# Patient Record
Sex: Male | Born: 1965
Health system: Southern US, Community
[De-identification: ages and names within clinical notes are randomized; demographics above are authoritative.]

## PROBLEM LIST (undated history)

## (undated) DIAGNOSIS — E7800 Pure hypercholesterolemia, unspecified: Secondary | ICD-10-CM

## (undated) HISTORY — DX: Pure hypercholesterolemia, unspecified: E78.00

## (undated) HISTORY — PX: NO PAST SURGERIES: SHX2092

---

## 2008-03-27 ENCOUNTER — Emergency Department (HOSPITAL_COMMUNITY): Admission: EM | Admit: 2008-03-27 | Discharge: 2008-03-27 | Payer: Self-pay | Admitting: Family Medicine

## 2011-04-13 LAB — POCT RAPID STREP A: Streptococcus, Group A Screen (Direct): NEGATIVE

## 2014-03-20 ENCOUNTER — Other Ambulatory Visit (HOSPITAL_COMMUNITY): Payer: Self-pay | Admitting: Orthopaedic Surgery

## 2014-03-20 DIAGNOSIS — M25531 Pain in right wrist: Secondary | ICD-10-CM

## 2014-03-22 ENCOUNTER — Ambulatory Visit (HOSPITAL_COMMUNITY)
Admission: RE | Admit: 2014-03-22 | Discharge: 2014-03-22 | Disposition: A | Discharge status: Home / Self Care | Payer: 59 | Source: Ambulatory Visit | Attending: Orthopaedic Surgery | Admitting: Orthopaedic Surgery

## 2014-03-22 DIAGNOSIS — S62123A Displaced fracture of lunate [semilunar], unspecified wrist, initial encounter for closed fracture: Secondary | ICD-10-CM | POA: Diagnosis not present

## 2014-03-22 DIAGNOSIS — S52599A Other fractures of lower end of unspecified radius, initial encounter for closed fracture: Secondary | ICD-10-CM | POA: Insufficient documentation

## 2014-03-22 DIAGNOSIS — X58XXXA Exposure to other specified factors, initial encounter: Secondary | ICD-10-CM | POA: Diagnosis not present

## 2014-03-22 DIAGNOSIS — Z4789 Encounter for other orthopedic aftercare: Secondary | ICD-10-CM | POA: Insufficient documentation

## 2014-03-22 DIAGNOSIS — M25531 Pain in right wrist: Secondary | ICD-10-CM

## 2014-03-22 DIAGNOSIS — R937 Abnormal findings on diagnostic imaging of other parts of musculoskeletal system: Secondary | ICD-10-CM | POA: Insufficient documentation

## 2014-03-22 DIAGNOSIS — M25539 Pain in unspecified wrist: Secondary | ICD-10-CM | POA: Diagnosis present

## 2015-07-30 MED FILL — ESZOPICLONE 3 MG TABLET: 3 | 30 days supply | Qty: 30 | Fill #0

## 2015-11-14 MED FILL — ESZOPICLONE 3 MG TABLET: 3 | 30 days supply | Qty: 30 | Fill #0

## 2016-02-10 MED FILL — ESZOPICLONE 3 MG TABLET: 3 | 30 days supply | Qty: 30 | Fill #0

## 2016-05-20 MED FILL — ESZOPICLONE 3 MG TABLET: 3 | 30 days supply | Qty: 30 | Fill #0

## 2016-06-12 MED FILL — GAVILYTE-N SOLUTION: 420 | 1 days supply | Qty: 4000 | Fill #0

## 2016-06-22 DIAGNOSIS — Z800 Family history of malignant neoplasm of digestive organs: Secondary | ICD-10-CM | POA: Diagnosis not present

## 2016-06-22 DIAGNOSIS — Z86010 Personal history of colonic polyps: Secondary | ICD-10-CM | POA: Diagnosis not present

## 2016-07-09 DIAGNOSIS — G4726 Circadian rhythm sleep disorder, shift work type: Secondary | ICD-10-CM | POA: Diagnosis not present

## 2016-07-09 DIAGNOSIS — Z0000 Encounter for general adult medical examination without abnormal findings: Secondary | ICD-10-CM | POA: Diagnosis not present

## 2016-07-09 DIAGNOSIS — Z125 Encounter for screening for malignant neoplasm of prostate: Secondary | ICD-10-CM | POA: Diagnosis not present

## 2016-08-05 MED FILL — ESZOPICLONE 3 MG TABLET: 3 | 30 days supply | Qty: 30 | Fill #0

## 2016-11-06 MED FILL — ESZOPICLONE 3 MG TABLET: 3 | 30 days supply | Qty: 30 | Fill #0

## 2016-12-23 ENCOUNTER — Emergency Department (HOSPITAL_BASED_OUTPATIENT_CLINIC_OR_DEPARTMENT_OTHER): Payer: Worker's Compensation

## 2016-12-23 ENCOUNTER — Encounter (HOSPITAL_BASED_OUTPATIENT_CLINIC_OR_DEPARTMENT_OTHER): Payer: Self-pay | Admitting: *Deleted

## 2016-12-23 ENCOUNTER — Emergency Department (HOSPITAL_BASED_OUTPATIENT_CLINIC_OR_DEPARTMENT_OTHER)
Admission: EM | Admit: 2016-12-23 | Discharge: 2016-12-23 | Disposition: A | Discharge status: Home / Self Care | Payer: Worker's Compensation | Attending: Emergency Medicine | Admitting: Emergency Medicine

## 2016-12-23 DIAGNOSIS — W01198A Fall on same level from slipping, tripping and stumbling with subsequent striking against other object, initial encounter: Secondary | ICD-10-CM | POA: Diagnosis not present

## 2016-12-23 DIAGNOSIS — Y9259 Other trade areas as the place of occurrence of the external cause: Secondary | ICD-10-CM | POA: Insufficient documentation

## 2016-12-23 DIAGNOSIS — Y9301 Activity, walking, marching and hiking: Secondary | ICD-10-CM | POA: Insufficient documentation

## 2016-12-23 DIAGNOSIS — S32010A Wedge compression fracture of first lumbar vertebra, initial encounter for closed fracture: Secondary | ICD-10-CM | POA: Insufficient documentation

## 2016-12-23 DIAGNOSIS — W010XXA Fall on same level from slipping, tripping and stumbling without subsequent striking against object, initial encounter: Secondary | ICD-10-CM

## 2016-12-23 DIAGNOSIS — S3992XA Unspecified injury of lower back, initial encounter: Secondary | ICD-10-CM | POA: Diagnosis present

## 2016-12-23 DIAGNOSIS — Y990 Civilian activity done for income or pay: Secondary | ICD-10-CM | POA: Diagnosis not present

## 2016-12-23 MED ORDER — IBUPROFEN 800 MG PO TABS
800.0000 mg | ORAL_TABLET | Freq: Once | ORAL | Status: AC
  Administered 2016-12-23: 800 mg via ORAL
  Filled 2016-12-23: qty 1

## 2016-12-23 MED ORDER — HYDROCODONE-ACETAMINOPHEN 5-325 MG PO TABS: 1.0000 | ORAL_TABLET | ORAL | 0 refills | Status: DC | PRN

## 2016-12-23 MED ORDER — HYDROCODONE-ACETAMINOPHEN 5-325 MG PO TABS
1.0000 | ORAL_TABLET | Freq: Once | ORAL | Status: AC
  Administered 2016-12-23: 1 via ORAL
  Filled 2016-12-23: qty 1

## 2016-12-23 NOTE — ED Provider Notes (Signed)
[facility] Provider Note: Lowella Dell, MD, FACEP  CSN: [id number] MRN: [id number] ARRIVAL: 12/23/16 at 0013 ROOM: MH01/MH01   CHIEF COMPLAINT  Back Injury   HISTORY OF PRESENT ILLNESS  Tyler Villarreal is a 51 y.o. male who slipped at work and fell about 9:30 yesterday evening. He fell onto his lower back. He is having 7 out of 10 pain in his upper lumbar region. The pain does not radiate. Pain is worse with movement of his lower back. He denies numbness or weakness in his lower extremities. He denies bowel or bladder changes. He denies other injury although he did have some transient left calf pain immediately after the fall.  Consultation with the [facility] state controlled substances database reveals the patient has received no opioid prescriptions in the past year.Marland Kitchen   History reviewed. No pertinent past medical history.  History reviewed. No pertinent surgical history.  History reviewed. No pertinent family history.  Social History  Substance Use Topics  . Smoking status: Never Smoker  . Smokeless tobacco: Never Used  . Alcohol use No    Prior to Admission medications   Not on File    Allergies Penicillins   REVIEW OF SYSTEMS  Negative except as noted here or in the History of Present Illness.   PHYSICAL EXAMINATION  Initial Vital Signs Blood pressure (!) 133/93, pulse 69, temperature 98.1 F (36.7 C), temperature source Oral, resp. rate 18, height 5\' 7"  (1.702 m), weight 81.6 kg (180 lb), SpO2 100 %.  Examination General: Well-developed, well-nourished male in no acute distress; appearance consistent with age of record HENT: normocephalic; atraumatic Eyes: pupils equal, round and reactive to light; extraocular muscles intact Neck: supple Heart: regular rate and rhythm Lungs: clear to auscultation bilaterally Abdomen: soft; nondistended; nontender; bowel sounds present Back: No lumbar tenderness; pain on movement of lower  back Extremities: No deformity; full range of motion; pulses normal Neurologic: Awake, alert and oriented; motor function intact in all extremities and symmetric; sensation intact in lower extremities and symmetric; no facial droop Skin: Warm and dry Psychiatric: Normal mood and affect   RESULTS  Summary of this visit's results, reviewed by myself:   EKG Interpretation  Date/Time:    Ventricular Rate:    PR Interval:    QRS Duration:   QT Interval:    QTC Calculation:   R Axis:     Text Interpretation:        Laboratory Studies: No results found for this or any previous visit (from the past 24 hour(s)). Imaging Studies: Dg Lumbar Spine Complete  Result Date: 12/23/2016 CLINICAL DATA:  Low back pain after fall on wet floor at work last night. EXAM: LUMBAR SPINE - COMPLETE 4+ VIEW COMPARISON:  None. FINDINGS: Mild acute appearing L1 compression fracture with less than 30% height loss. The remaining lumbar vertebral bodies intact. No malalignment. Intervertebral disc heights are normal. No destructive bony lesions. Sacroiliac joints are symmetric. Included prevertebral and paraspinal soft tissue planes are non-suspicious. IMPRESSION: Acute mild L1 compression fracture.  No malalignment. Electronically Signed   By: Awilda Metro M.D.   On: 12/23/2016 01:24   Ct Lumbar Spine Wo Contrast  Result Date: 12/23/2016 CLINICAL DATA:  Larey Seat on floor, L1 compression fracture EXAM: CT LUMBAR SPINE WITHOUT CONTRAST TECHNIQUE: Multidetector CT imaging of the lumbar spine was performed without intravenous contrast administration. Multiplanar CT image reconstructions were also generated. COMPARISON:  12/23/2016 FINDINGS: Segmentation: 5 lumbar type vertebrae. Alignment: Normal. Vertebrae: Mild inferior endplate  compression fracture of L1 with approximately 25% loss of height anteriorly. No retropulsion of fracture fragments. Paraspinal and other soft tissues: Negative Disc levels: Mild narrowing at  L1-L2. Remaining disc spaces are maintained. At L2-L3 no significant disc disease or canal stenosis. At L3-L4, no significant canal stenosis or disc disease At L4-L5, mild broad-based disc.  No canal stenosis. At L5 S1, mild diffuse disc bulge. No canal stenosis. Foramen are patent bilaterally. IMPRESSION: Mild inferior endplate compression fracture of L1 with approximate 25% loss of height anteriorly. No retropulsion of fracture fragments. Electronically Signed   By: Jasmine PangKim  Fujinaga M.D.   On: 12/23/2016 02:44    ED COURSE  Nursing notes and initial vitals signs, including pulse oximetry, reviewed.  Vitals:   12/23/16 0025  BP: (!) 133/93  Pulse: 69  Resp: 18  Temp: 98.1 F (36.7 C)  TempSrc: Oral  SpO2: 100%  Weight: 81.6 kg (180 lb)  Height: 5\' 7"  (1.702 m)    PROCEDURES    ED DIAGNOSES     ICD-10-CM   1. Fall from slip, trip, or stumble, initial encounter W01.0XXA   2. Closed compression fracture of first lumbar vertebra, initial encounter (HCC) S32.010A        Henretter Piekarski, Jonny RuizJohn, MD 12/23/16 [phone]

## 2016-12-23 NOTE — ED Triage Notes (Signed)
Pt slipped this Pm on a spill onto lower back presents with lower back pain

## 2016-12-23 NOTE — ED Notes (Signed)
Patient transported to CT 

## 2016-12-24 MED FILL — HYDROCODON-APAP 5-325: 5-325 | 5 days supply | Qty: 30 | Fill #0

## 2016-12-25 MED FILL — METHOCARBAMOL 500 MG TABLET: 500 | 10 days supply | Qty: 20 | Fill #0

## 2016-12-25 MED FILL — MELOXICAM 15 MG TABLET: 15 | 30 days supply | Qty: 30 | Fill #0

## 2016-12-25 MED FILL — CYCLOBENZAPRINE 5 MG TABLET: 5 | 30 days supply | Qty: 60 | Fill #0

## 2016-12-28 MED FILL — HYDROCODON-APAP 5-325: 5-325 | 8 days supply | Qty: 60 | Fill #0

## 2017-01-12 MED FILL — HYDROCODON-APAP 5-325: 5-325 | 8 days supply | Qty: 60 | Fill #0

## 2017-03-12 MED FILL — METHOCARBAMOL 500 MG TABLET: 500 | 30 days supply | Qty: 60 | Fill #0

## 2017-04-07 MED FILL — ESZOPICLONE 3 MG TABLET: 3 | 30 days supply | Qty: 30 | Fill #0

## 2017-07-23 MED FILL — ESZOPICLONE 3 MG TABLET: 3 | 30 days supply | Qty: 30 | Fill #0

## 2018-02-07 MED FILL — ESZOPICLONE 3 MG TABS: 3 | 30 days supply | Qty: 30 | Fill #0

## 2018-05-16 MED FILL — ESZOPICLONE 3 MG TABS: 3 | 30 days supply | Qty: 30 | Fill #0

## 2019-01-31 ENCOUNTER — Ambulatory Visit
Admission: RE | Admit: 2019-01-31 | Discharge: 2019-01-31 | Disposition: A | Discharge status: Home / Self Care | Payer: No Typology Code available for payment source | Source: Ambulatory Visit | Attending: Family Medicine | Admitting: Family Medicine

## 2019-01-31 ENCOUNTER — Other Ambulatory Visit: Payer: Self-pay | Admitting: Family Medicine

## 2019-01-31 DIAGNOSIS — M79671 Pain in right foot: Secondary | ICD-10-CM

## 2019-07-27 ENCOUNTER — Ambulatory Visit: Payer: No Typology Code available for payment source | Attending: Internal Medicine

## 2019-07-27 DIAGNOSIS — Z20822 Contact with and (suspected) exposure to covid-19: Secondary | ICD-10-CM

## 2019-07-28 ENCOUNTER — Telehealth: Payer: Self-pay | Admitting: General Practice

## 2019-07-28 LAB — NOVEL CORONAVIRUS, NAA: SARS-CoV-2, NAA: DETECTED — AB

## 2019-07-28 NOTE — Telephone Encounter (Signed)
Pt and wife given Covid 19 results. Expressed understanding. NT unavailable and they did not wish to speak with them at the time. Stated they would follow up with pt's PCP.

## 2019-07-30 ENCOUNTER — Other Ambulatory Visit: Payer: Self-pay

## 2019-07-30 ENCOUNTER — Ambulatory Visit
Admission: EM | Admit: 2019-07-30 | Discharge: 2019-07-30 | Disposition: A | Discharge status: Home / Self Care | Payer: No Typology Code available for payment source | Attending: Family Medicine | Admitting: Family Medicine

## 2019-07-30 ENCOUNTER — Telehealth: Payer: No Typology Code available for payment source

## 2019-07-30 ENCOUNTER — Ambulatory Visit (INDEPENDENT_AMBULATORY_CARE_PROVIDER_SITE_OTHER): Payer: No Typology Code available for payment source

## 2019-07-30 DIAGNOSIS — J189 Pneumonia, unspecified organism: Secondary | ICD-10-CM | POA: Diagnosis not present

## 2019-07-30 DIAGNOSIS — U071 COVID-19: Secondary | ICD-10-CM

## 2019-07-30 DIAGNOSIS — R0602 Shortness of breath: Secondary | ICD-10-CM

## 2019-07-30 MED ORDER — LEVOFLOXACIN 750 MG PO TABS: 750.0000 mg | ORAL_TABLET | Freq: Every day | ORAL | 0 refills | Status: AC

## 2019-07-30 NOTE — Discharge Instructions (Signed)
Rest, fluids, over the counter medications °

## 2019-07-30 NOTE — ED Triage Notes (Signed)
Patient states that he was diagnosed with Covid on Friday. Patient states that today he is having worsening shortness of breath, unable to rest/sleep, severe fatigue, headache. Patient states that he did an E-visit and he was advised to come here and be evaluated for possible chest x-ray.

## 2019-08-01 ENCOUNTER — Other Ambulatory Visit: Payer: Self-pay

## 2019-08-01 ENCOUNTER — Encounter: Payer: Self-pay | Admitting: Emergency Medicine

## 2019-08-01 ENCOUNTER — Ambulatory Visit
Admission: EM | Admit: 2019-08-01 | Discharge: 2019-08-01 | Disposition: A | Discharge status: Home / Self Care | Payer: No Typology Code available for payment source | Attending: Family Medicine | Admitting: Family Medicine

## 2019-08-01 DIAGNOSIS — J1282 Pneumonia due to coronavirus disease 2019: Secondary | ICD-10-CM | POA: Diagnosis not present

## 2019-08-01 DIAGNOSIS — U071 COVID-19: Secondary | ICD-10-CM

## 2019-08-01 DIAGNOSIS — R059 Cough, unspecified: Secondary | ICD-10-CM

## 2019-08-01 DIAGNOSIS — R05 Cough: Secondary | ICD-10-CM | POA: Diagnosis not present

## 2019-08-01 MED ORDER — PREDNISONE 50 MG PO TABS: ORAL_TABLET | ORAL | 0 refills | Status: DC

## 2019-08-01 MED ORDER — HYDROCODONE-HOMATROPINE 5-1.5 MG/5ML PO SYRP: 5.0000 mL | ORAL_SOLUTION | Freq: Four times a day (QID) | ORAL | 0 refills | Status: DC | PRN

## 2019-08-01 NOTE — ED Provider Notes (Addendum)
[facility]    CSN: [id number] Arrival date & time: 08/01/19  0254      History   Chief Complaint Chief Complaint  Patient presents with  . covid positive    APPT  . Shortness of Breath   HPI  54 year old male presents with the above complaints.  Patient recently seen here on 1/17.  Tested positive for COVID-19.  Chest x-ray revealed findings consistent with multifocal pneumonia.  I suspect that this is secondary to COVID-19 as opposed to being bacterial pneumonia.  Patient was placed empirically on Levaquin.  Patient reports that he continues to feel poorly.  He does not feel like the antibiotic is working.  He continues to have cough and shortness of breath.  He has difficulty sleeping as a result.  He also reports back pain in the thoracic region.  Rates his pain a 6/10 in severity.  He has been taking over-the-counter Delsym for cough without resolution.  Patient feels very poorly. Feels fatigued.  He is concerned given the lack of improvement in symptoms.  No other complaints or concerns at this time.  PMH, Surgical Hx, Family Hx, Social History reviewed and updated as below.  No significant PMH.  Past Surgical History:  Procedure Laterality Date  . NO PAST SURGERIES     Home Medications    Prior to Admission medications   Medication Sig Start Date End Date Taking? Authorizing Provider  levofloxacin (LEVAQUIN) 750 MG tablet Take 1 tablet (750 mg total) by mouth daily for 5 days. 07/30/19 08/04/19 Yes Conty, Linward Foster, MD  HYDROcodone-homatropine [facility]) 5-1.5 MG/5ML syrup Take 5 mLs by mouth every 6 (six) hours as needed. 08/01/19   Coral Spikes, DO  predniSONE (DELTASONE) 50 MG tablet 1 tablet daily x 5 days 08/01/19   Coral Spikes, DO   Family History Family History  Problem Relation Age of Onset  . Parkinson's disease Mother   . Diabetes Father   . Stomach cancer Father    Social History Social History   Tobacco Use  . Smoking status: Never Smoker    . Smokeless tobacco: Never Used  Substance Use Topics  . Alcohol use: Yes    Comment: occasionally  . Drug use: No   Allergies   Penicillins   Review of Systems Review of Systems  Constitutional: Positive for fatigue.  Respiratory: Positive for cough and shortness of breath.    Physical Exam Triage Vital Signs ED Triage Vitals [08/01/19 0851]  Enc Vitals Group     BP (!) 149/101     Pulse Rate 96     Resp 18     Temp 98.8 F (37.1 C)     Temp Source Oral     SpO2 98 %     Weight 182 lb (82.6 kg)     Height 5\' 7"  (1.702 m)     Head Circumference      Peak Flow      Pain Score 6     Pain Loc      Pain Edu?      Excl. in [place]?    Updated Vital Signs BP (!) 149/101 (BP Location: Left Arm)   Pulse 96   Temp 98.8 F (37.1 C) (Oral)   Resp 18   Ht 5\' 7"  (1.702 m)   Wt 82.6 kg   SpO2 98%   BMI 28.51 kg/m   Visual Acuity Right Eye Distance:   Left Eye Distance:   Bilateral Distance:  Right Eye Near:   Left Eye Near:    Bilateral Near:     Physical Exam Vitals and nursing note reviewed.  Constitutional:      Comments: Appears mildly ill and fatigued.  No acute distress.  HENT:     Head: Normocephalic and atraumatic.  Eyes:     General:        Right eye: No discharge.        Left eye: No discharge.     Conjunctiva/sclera: Conjunctivae normal.  Cardiovascular:     Rate and Rhythm: Normal rate and regular rhythm.     Heart sounds: No murmur.  Pulmonary:     Effort: Pulmonary effort is normal.     Breath sounds: No wheezing or rhonchi.     Comments: Patient coughing throughout exam making auscultation difficult. Skin:    General: Skin is warm.     Findings: No rash.  Neurological:     Mental Status: He is alert.  Psychiatric:        Mood and Affect: Mood normal.        Behavior: Behavior normal.    UC Treatments / Results  Labs (all labs ordered are listed, but only abnormal results are displayed) Labs Reviewed - No data to  display  EKG   Radiology DG Chest 2 View  Result Date: 07/30/2019 CLINICAL DATA:  Shortness of breath. EXAM: CHEST - 2 VIEW COMPARISON:  March 27, 2008. FINDINGS: The heart size and mediastinal contours are within normal limits. No pneumothorax or pleural effusion is noted. Multiple ill-defined opacities are noted in the right lower lobe with minimal left basilar opacity, consistent with multifocal pneumonia. The visualized skeletal structures are unremarkable. IMPRESSION: Findings consistent with multifocal pneumonia, right greater than left. Electronically Signed   By: Lupita Raider M.D.   On: 07/30/2019 13:24    Procedures Procedures (including critical care time)  Medications Ordered in UC Medications - No data to display  Initial Impression / Assessment and Plan / UC Course  I have reviewed the triage vital signs and the nursing notes.  Pertinent labs & imaging results that were available during my care of the patient were reviewed by me and considered in my medical decision making (see chart for details).    54 year old male presents with COVID-19 pneumonia.  Previously seen on 1/17. Chest xray reviewed.  Patient worsening,  Placing on prednisone.  Regarding his cough, I am treating him with Hycodan.  Advised him that if he worsens, he needs to go to the hospital given limited outpatient treatment of COVID-19.  Final Clinical Impressions(s) / UC Diagnoses   Final diagnoses:  Pneumonia due to COVID-19 virus  Cough     Discharge Instructions     Stay hydrated.  Medications as prescribed.  If you continue to worsen, you need to go to the hospital.  Take care  Dr. Adriana Simas     ED Prescriptions    Medication Sig Dispense Auth. Provider   HYDROcodone-homatropine (HYCODAN) 5-1.5 MG/5ML syrup Take 5 mLs by mouth every 6 (six) hours as needed. 120 mL Jaselyn Nahm G, DO   predniSONE (DELTASONE) 50 MG tablet 1 tablet daily x 5 days 5 tablet Everlene Other G, DO     PDMP  not reviewed this encounter.   Tommie Sams, DO 08/01/19 0943    Tommie Sams, DO 08/01/19 [phone]

## 2019-08-01 NOTE — ED Triage Notes (Signed)
Patient in today c/o continued sob. Patient is covid positive. Patient seen her 07/30/19 for same and had a CXR and diagnosed with pneumonia. Patient was given an antibiotic.

## 2019-08-01 NOTE — Discharge Instructions (Signed)
Stay hydrated.  Medications as prescribed.  If you continue to worsen, you need to go to the hospital.  Take care  Dr. Adriana Simas

## 2019-08-02 ENCOUNTER — Telehealth: Payer: Self-pay | Admitting: Internal Medicine

## 2019-08-02 NOTE — Telephone Encounter (Signed)
Called to discuss with Maris Berger about Covid symptoms and the use of bamlanivimab, a monoclonal antibody infusion for those with mild to moderate Covid symptoms and at a high risk of hospitalization.     Pt is not qualified for this infusion due to lack of identified risk factors and co-morbid conditions.  Symptoms reviewed as well as criteria for ending isolation.  Symptoms reviewed that would warrant ED/Hospital evaluation as well should her condition worsen. Preventative practices reviewed. Patient verbalized understanding.    There are no problems to display for this patient.  Cordelia Pen, NP-C [facility]  pgr [phone]

## 2019-08-02 NOTE — ED Provider Notes (Signed)
[facility]    CSN: [id number] Arrival date & time: 07/30/19  1216      History   Chief Complaint Chief Complaint  Patient presents with  . Covid Positive  . Shortness of Breath    HPI Tyler Villarreal is a 54 y.o. male.   54 yo male with a c/o worsening shortness of breath, fatigue, headaches since testing positive for covid 2 days ago (Friday). Denies any chest pains.    Shortness of Breath   History reviewed. No pertinent past medical history.  There are no problems to display for this patient.   Past Surgical History:  Procedure Laterality Date  . NO PAST SURGERIES         Home Medications    Prior to Admission medications   Medication Sig Start Date End Date Taking? Authorizing Provider  HYDROcodone-homatropine (HYCODAN) 5-1.5 MG/5ML syrup Take 5 mLs by mouth every 6 (six) hours as needed. 08/01/19   Coral Spikes, DO  levofloxacin (LEVAQUIN) 750 MG tablet Take 1 tablet (750 mg total) by mouth daily for 5 days. 07/30/19 08/04/19  Norval Gable, MD  predniSONE (DELTASONE) 50 MG tablet 1 tablet daily x 5 days 08/01/19   Coral Spikes, DO    Family History Family History  Problem Relation Age of Onset  . Parkinson's disease Mother   . Diabetes Father   . Stomach cancer Father     Social History Social History   Tobacco Use  . Smoking status: Never Smoker  . Smokeless tobacco: Never Used  Substance Use Topics  . Alcohol use: Yes    Comment: occasionally  . Drug use: No     Allergies   Penicillins   Review of Systems Review of Systems  Respiratory: Positive for shortness of breath.      Physical Exam Triage Vital Signs ED Triage Vitals  Enc Vitals Group     BP 07/30/19 1224 (!) 135/91     Pulse Rate 07/30/19 1224 (!) 105     Resp 07/30/19 1224 19     Temp 07/30/19 1224 98.9 F (37.2 C)     Temp Source 07/30/19 1224 Oral     SpO2 07/30/19 1224 96 %     Weight 07/30/19 1221 182 lb (82.6 kg)     Height 07/30/19 1221 5\' 7"   (1.702 m)     Head Circumference --      Peak Flow --      Pain Score 07/30/19 1221 7     Pain Loc --      Pain Edu? --      Excl. in [place]? --    No data found.  Updated Vital Signs BP (!) 135/91 (BP Location: Left Arm)   Pulse (!) 105   Temp 98.9 F (37.2 C) (Oral)   Resp 19   Ht 5\' 7"  (1.702 m)   Wt 82.6 kg   SpO2 96%   BMI 28.51 kg/m   Visual Acuity Right Eye Distance:   Left Eye Distance:   Bilateral Distance:    Right Eye Near:   Left Eye Near:    Bilateral Near:     Physical Exam Vitals and nursing note reviewed.  Constitutional:      General: He is not in acute distress.    Appearance: He is not toxic-appearing or diaphoretic.  Cardiovascular:     Rate and Rhythm: Tachycardia present.     Heart sounds: Normal heart sounds.  Pulmonary:  Breath sounds: Rales (bases) present.  Neurological:     Mental Status: He is alert.      UC Treatments / Results  Labs (all labs ordered are listed, but only abnormal results are displayed) Labs Reviewed - No data to display  EKG   Radiology No results found.  Procedures Procedures (including critical care time)  Medications Ordered in UC Medications - No data to display  Initial Impression / Assessment and Plan / UC Course  I have reviewed the triage vital signs and the nursing notes.  Pertinent labs & imaging results that were available during my care of the patient were reviewed by me and considered in my medical decision making (see chart for details).      Final Clinical Impressions(s) / UC Diagnoses   Final diagnoses:  COVID-19 virus infection  Multifocal pneumonia     Discharge Instructions     Rest, fluids, over the counter medications    ED Prescriptions    Medication Sig Dispense Auth. Provider   levofloxacin (LEVAQUIN) 750 MG tablet Take 1 tablet (750 mg total) by mouth daily for 5 days. 5 tablet Payton Mccallum, MD      1. x-ray results and diagnosis reviewed with  patient 2. rx as per orders above; reviewed possible side effects, interactions, risks and benefits  3. Recommend supportive treatment as above 4. Follow-up prn if symptoms worsen or don't improve  PDMP not reviewed this encounter.   Payton Mccallum, MD 08/02/19 [phone]

## 2019-09-01 ENCOUNTER — Ambulatory Visit
Admission: RE | Admit: 2019-09-01 | Discharge: 2019-09-01 | Disposition: A | Discharge status: Home / Self Care | Payer: No Typology Code available for payment source | Source: Ambulatory Visit | Attending: Physician Assistant | Admitting: Physician Assistant

## 2019-09-01 ENCOUNTER — Other Ambulatory Visit: Payer: Self-pay | Admitting: Physician Assistant

## 2019-09-01 DIAGNOSIS — Z09 Encounter for follow-up examination after completed treatment for conditions other than malignant neoplasm: Secondary | ICD-10-CM

## 2020-06-24 ENCOUNTER — Ambulatory Visit: Payer: No Typology Code available for payment source

## 2020-06-24 ENCOUNTER — Ambulatory Visit (INDEPENDENT_AMBULATORY_CARE_PROVIDER_SITE_OTHER): Payer: No Typology Code available for payment source

## 2020-06-24 ENCOUNTER — Ambulatory Visit
Admission: EM | Admit: 2020-06-24 | Discharge: 2020-06-24 | Disposition: A | Discharge status: Home / Self Care | Payer: No Typology Code available for payment source | Attending: Family Medicine | Admitting: Family Medicine

## 2020-06-24 DIAGNOSIS — R2242 Localized swelling, mass and lump, left lower limb: Secondary | ICD-10-CM

## 2020-06-24 DIAGNOSIS — M25572 Pain in left ankle and joints of left foot: Secondary | ICD-10-CM | POA: Diagnosis not present

## 2020-06-24 HISTORY — DX: Pure hypercholesterolemia, unspecified: E78.00

## 2020-06-24 NOTE — ED Triage Notes (Signed)
Pt presents to Urgent Care with c/o L ankle pain. Reports that he "rolled it" while blowing leaves when stepping off uneven surface. He fell but reports no other injuries. Lateral L ankle noted to be swollen.

## 2020-06-24 NOTE — Discharge Instructions (Addendum)
Your x-ray was normal just some mild soft tissue swelling and fluid.  We will give you a ankle brace to wear.  Continue to rest, elevate as much as possible.  Ibuprofen for pain, inflammation and swelling as needed. Follow up as needed for continued or worsening symptoms Sports med for any continued issues

## 2020-06-24 NOTE — ED Provider Notes (Signed)
Tyler Villarreal    CSN: [id number] Arrival date & time: 06/24/20  0825      History   Chief Complaint Chief Complaint  Patient presents with   Ankle Pain    Left    HPI Tyler Villarreal is a 54 y.o. male.   Patient is a 54 year old male presents today for complaints of left ankle pain x1 week.  Reports that he "rolled it" while blowing leaves and stepped off on uneven surface.  Concern due to continued swelling.  Pain is to the lateral malleolus area.  No bruising, deformities.  Able to ambulate.  No numbness, tingling.     Past Medical History:  Diagnosis Date   Hypercholesteremia     There are no problems to display for this patient.   Past Surgical History:  Procedure Laterality Date   NO PAST SURGERIES         Home Medications    Prior to Admission medications   Medication Sig Start Date End Date Taking? Authorizing Provider  ibuprofen (ADVIL) 200 MG tablet Take 200 mg by mouth every 6 (six) hours as needed.    [provider]    Family History Family History  Problem Relation Age of Onset   Parkinson's disease Mother    Diabetes Father    Stomach cancer Father     Social History Social History   Tobacco Use   Smoking status: Never Smoker   Smokeless tobacco: Never Used  Building services engineer Use: Never used  Substance Use Topics   Alcohol use: Yes    Comment: occasionally   Drug use: No     Allergies   Penicillins   Review of Systems Review of Systems   Physical Exam Triage Vital Signs ED Triage Vitals  Enc Vitals Group     BP 06/24/20 0829 (!) 137/95     Pulse Rate 06/24/20 0829 95     Resp 06/24/20 0829 16     Temp 06/24/20 0829 98 F (36.7 C)     Temp src --      SpO2 06/24/20 0829 98 %     Weight 06/24/20 0835 180 lb (81.6 kg)     Height 06/24/20 0835 5\' 7"  (1.702 m)     Head Circumference --      Peak Flow --      Pain Score 06/24/20 0835 7     Pain Loc --      Pain Edu? --      Excl. in  [place]? --    No data found.  Updated Vital Signs BP (!) 137/95 (BP Location: Left Arm)    Pulse 95    Temp 98 F (36.7 C)    Resp 16    Ht 5\' 7"  (1.702 m)    Wt 180 lb (81.6 kg)    SpO2 98%    BMI 28.19 kg/m   Visual Acuity Right Eye Distance:   Left Eye Distance:   Bilateral Distance:    Right Eye Near:   Left Eye Near:    Bilateral Near:     Physical Exam Vitals and nursing note reviewed.  Constitutional:      Appearance: Normal appearance.  HENT:     Head: Normocephalic and atraumatic.     Nose: Nose normal.  Eyes:     Conjunctiva/sclera: Conjunctivae normal.  Pulmonary:     Effort: Pulmonary effort is normal.  Musculoskeletal:        General: Normal  range of motion.     Cervical back: Normal range of motion.       Feet:  Skin:    General: Skin is warm and dry.  Neurological:     Mental Status: He is alert.  Psychiatric:        Mood and Affect: Mood normal.      UC Treatments / Results  Labs (all labs ordered are listed, but only abnormal results are displayed) Labs Reviewed - No data to display  EKG   Radiology DG Ankle Complete Left  Result Date: 06/24/2020 CLINICAL DATA:  Ankle pain post injury, inverted LEFT foot on Saturday with pain bruising and swelling of lateral malleolus. EXAM: LEFT ANKLE COMPLETE - 3+ VIEW COMPARISON:  None FINDINGS: Soft tissue swelling over the lateral malleolus. Ankle mortise is intact. No sign of acute fracture or dislocation. Vascular calcifications in the soft tissues, probable small LEFT ankle effusion. Irregularity over the superior margin of the navicular may represent a combination of degenerative change and prior trauma. IMPRESSION: Soft tissue swelling over the lateral malleolus. No acute fracture or dislocation. Probable small LEFT ankle effusion. Electronically Signed   By: Donzetta Kohut M.D.   On: 06/24/2020 08:52    Procedures Procedures (including critical care time)  Medications Ordered in UC Medications -  No data to display  Initial Impression / Assessment and Plan / UC Course  I have reviewed the triage vital signs and the nursing notes.  Pertinent labs & imaging results that were available during my care of the patient were reviewed by me and considered in my medical decision making (see chart for details).     Ankle injury No concern for fracture on x-ray.  Just soft tissue swelling and underlying small effusion. Most likely bad sprain.  Applied ASO here in clinic today.  We will have him continue to rest, ice, elevate. Ibuprofen as needed. Follow up as needed for continued or worsening symptoms  Final Clinical Impressions(s) / UC Diagnoses   Final diagnoses:  None     Discharge Instructions     Your x-ray was normal just some mild soft tissue swelling and fluid.  We will give you a ankle brace to wear.  Continue to rest, elevate as much as possible.  Ibuprofen for pain, inflammation and swelling as needed. Follow up as needed for continued or worsening symptoms Sports med for any continued issues     ED Prescriptions    None     PDMP not reviewed this encounter.   Janace Aris, NP 06/24/20 [phone]

## 2020-12-12 ENCOUNTER — Other Ambulatory Visit: Payer: Self-pay

## 2020-12-12 MED ORDER — GABAPENTIN 100 MG PO CAPS
ORAL_CAPSULE | ORAL | 1 refills | Status: AC
  Filled 2020-12-12: qty 30, 30d supply, fill #0
  Filled 2021-02-17: qty 30, 30d supply, fill #1

## 2020-12-18 ENCOUNTER — Ambulatory Visit: Payer: No Typology Code available for payment source | Attending: Internal Medicine

## 2020-12-18 DIAGNOSIS — Z23 Encounter for immunization: Secondary | ICD-10-CM

## 2020-12-18 NOTE — Progress Notes (Signed)
   Covid-19 Vaccination Clinic  Name:  Tyler Villarreal    MRN: [id number] DOB: [birth year]  12/18/2020  Mr. Tyler Villarreal was observed post Covid-19 immunization for 15 minutes without incident. He was provided with Vaccine Information Sheet and instruction to access the V-Safe system.   Mr. Tyler Villarreal was instructed to call 911 with any severe reactions post vaccine: Marland Kitchen Difficulty breathing  . Swelling of face and throat  . A fast heartbeat  . A bad rash all over body  . Dizziness and weakness   Immunizations Administered    Name Date Dose VIS Date Route   PFIZER Comrnaty(Gray TOP) Covid-19 Vaccine 12/18/2020 12:11 PM 0.3 mL 06/20/2020 Intramuscular   Manufacturer: [employer], [employer]   Lot: [id number]   NDC: [phone]

## 2020-12-25 ENCOUNTER — Ambulatory Visit: Payer: No Typology Code available for payment source

## 2020-12-26 ENCOUNTER — Other Ambulatory Visit (HOSPITAL_COMMUNITY): Payer: Self-pay

## 2020-12-26 MED ORDER — COVID-19 MRNA VAC-TRIS(PFIZER) 30 MCG/0.3ML IM SUSP
INTRAMUSCULAR | 0 refills | Status: AC
  Filled 2020-12-26: qty 0.3, 17d supply, fill #0

## 2020-12-27 ENCOUNTER — Other Ambulatory Visit (HOSPITAL_COMMUNITY): Payer: Self-pay

## 2021-01-01 ENCOUNTER — Other Ambulatory Visit: Payer: Self-pay

## 2021-01-01 MED ORDER — GABAPENTIN 100 MG PO CAPS
ORAL_CAPSULE | ORAL | 0 refills | Status: DC
  Filled 2021-01-01: qty 77, 25d supply, fill #0

## 2021-01-02 ENCOUNTER — Other Ambulatory Visit (HOSPITAL_COMMUNITY): Payer: Self-pay

## 2021-01-06 ENCOUNTER — Other Ambulatory Visit: Payer: Self-pay

## 2021-01-07 ENCOUNTER — Other Ambulatory Visit: Payer: Self-pay

## 2021-02-17 ENCOUNTER — Other Ambulatory Visit: Payer: Self-pay

## 2021-02-17 MED ORDER — GABAPENTIN 100 MG PO CAPS
ORAL_CAPSULE | ORAL | 0 refills | Status: AC
  Filled 2021-02-17: qty 77, 30d supply, fill #0

## 2021-03-28 ENCOUNTER — Other Ambulatory Visit: Payer: Self-pay

## 2021-03-31 ENCOUNTER — Other Ambulatory Visit: Payer: Self-pay

## 2021-03-31 MED ORDER — PEG 3350-KCL-NA BICARB-NACL 420 G PO SOLR
ORAL | 0 refills | Status: AC
  Filled 2021-03-31: qty 4000, 1d supply, fill #0

## 2022-03-14 IMAGING — DX DG ANKLE COMPLETE 3+V*L*
3 series · 3 of 3 positions shown · non-contrast
Comparison: None

CLINICAL DATA: Ankle pain post injury, inverted LEFT foot on
[REDACTED] with pain bruising and swelling of lateral malleolus.

EXAM:
LEFT ANKLE COMPLETE - 3+ VIEW

[ankle ap]
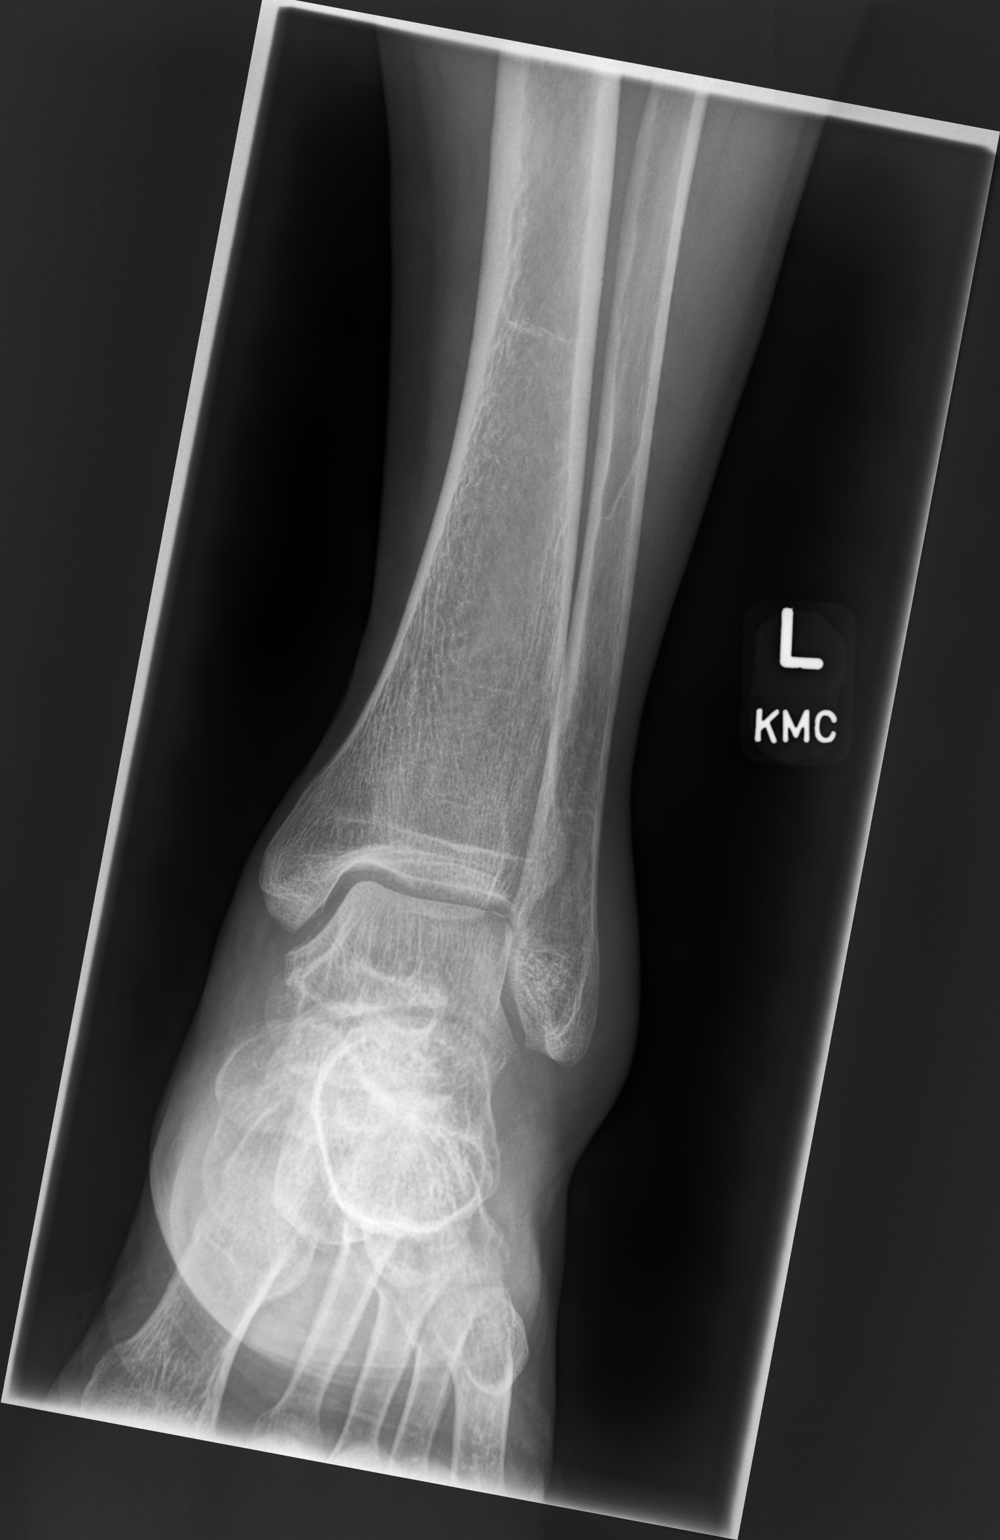

[ankle mlo]
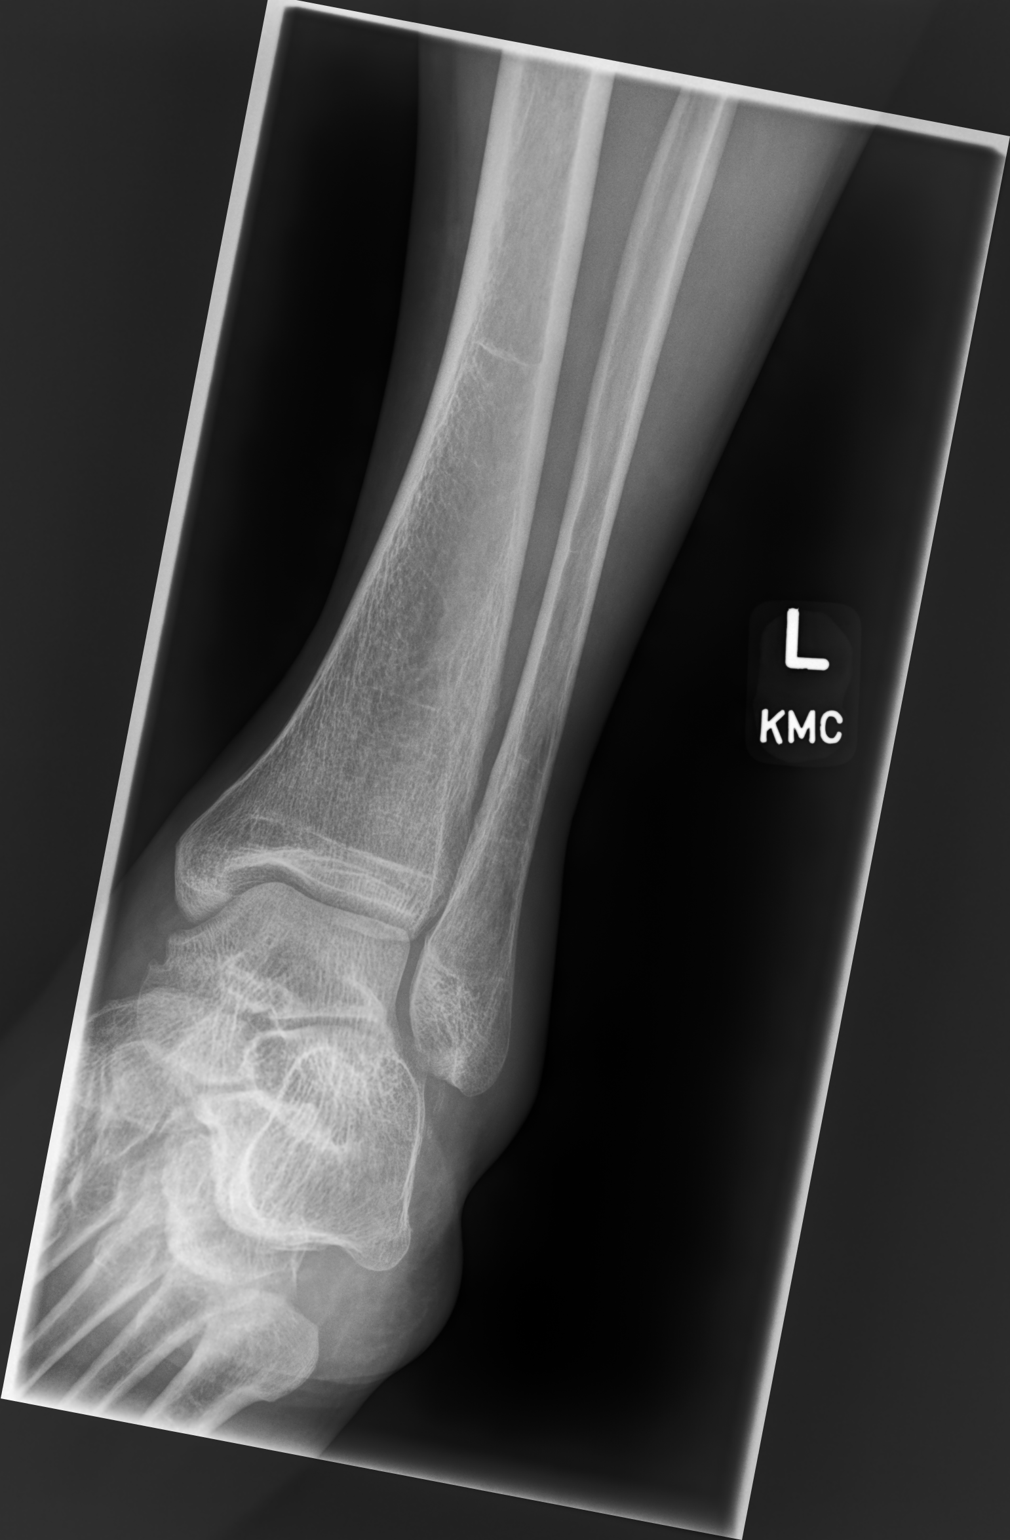

[ankle lat]
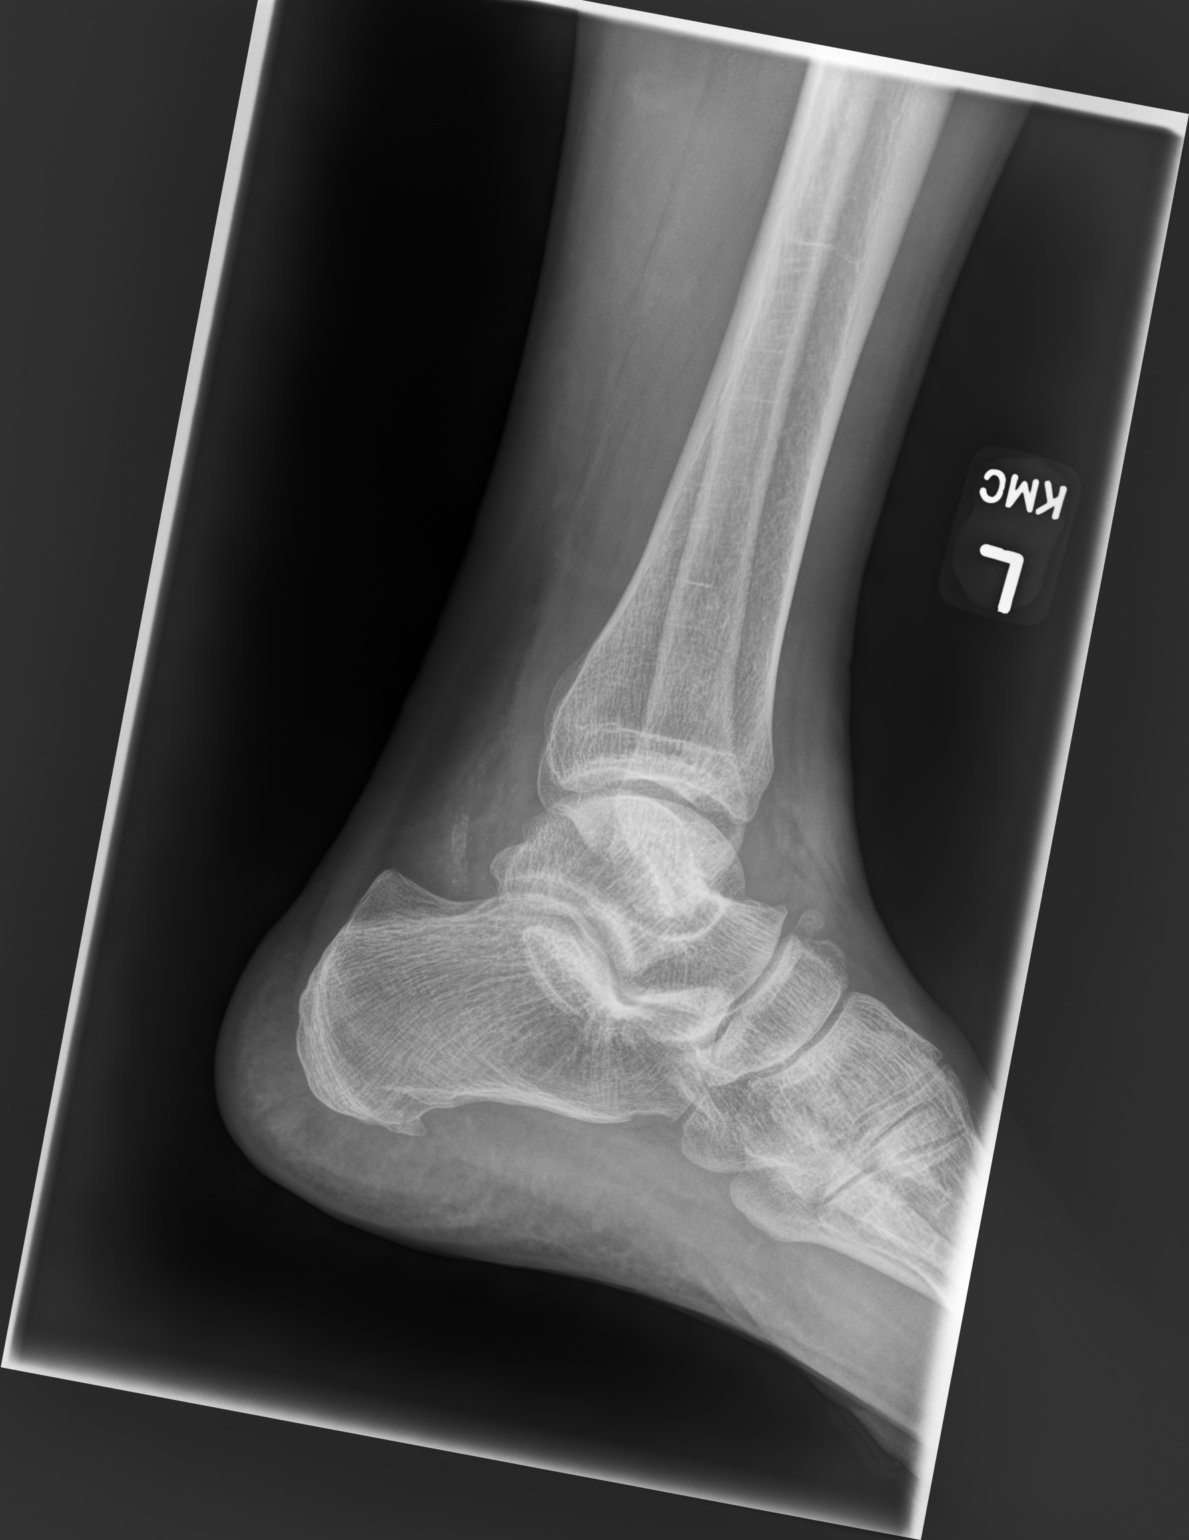

[3 of 3 positions shown; findings below may reference images not displayed]

FINDINGS: Soft tissue swelling over the lateral malleolus. Ankle mortise is
intact. No sign of acute fracture or dislocation. Vascular
calcifications in the soft tissues, probable small LEFT ankle
effusion.

Irregularity over the superior margin of the navicular may represent
a combination of degenerative change and prior trauma.
IMPRESSION: Soft tissue swelling over the lateral malleolus. No acute fracture
or dislocation. Probable small LEFT ankle effusion.

## 2022-12-17 ENCOUNTER — Other Ambulatory Visit: Payer: Self-pay

## 2022-12-17 DIAGNOSIS — H02401 Unspecified ptosis of right eyelid: Secondary | ICD-10-CM | POA: Diagnosis not present

## 2022-12-17 DIAGNOSIS — Z125 Encounter for screening for malignant neoplasm of prostate: Secondary | ICD-10-CM | POA: Diagnosis not present

## 2022-12-17 DIAGNOSIS — Z7689 Persons encountering health services in other specified circumstances: Secondary | ICD-10-CM | POA: Diagnosis not present

## 2022-12-17 DIAGNOSIS — B351 Tinea unguium: Secondary | ICD-10-CM | POA: Diagnosis not present

## 2022-12-17 DIAGNOSIS — Z0000 Encounter for general adult medical examination without abnormal findings: Secondary | ICD-10-CM | POA: Diagnosis not present

## 2022-12-17 DIAGNOSIS — H02539 Eyelid retraction unspecified eye, unspecified lid: Secondary | ICD-10-CM | POA: Diagnosis not present

## 2022-12-17 MED ORDER — CICLOPIROX 8 % EX SOLN
1.0000 | Freq: Every day | CUTANEOUS | 0 refills | Status: AC
  Filled 2022-12-17: qty 6.6, 30d supply, fill #0

## 2022-12-17 MED ORDER — TERBINAFINE HCL 250 MG PO TABS
250.0000 mg | ORAL_TABLET | Freq: Every day | ORAL | 2 refills | Status: AC
  Filled 2022-12-17: qty 30, 30d supply, fill #0
  Filled 2023-01-19: qty 30, 30d supply, fill #1

## 2023-03-11 ENCOUNTER — Other Ambulatory Visit: Payer: Self-pay

## 2023-03-11 MED ORDER — TERBINAFINE HCL 250 MG PO TABS
250.0000 mg | ORAL_TABLET | Freq: Every day | ORAL | 2 refills | Status: AC
  Filled 2023-03-11: qty 30, 30d supply, fill #0

## 2023-03-30 ENCOUNTER — Other Ambulatory Visit (HOSPITAL_COMMUNITY): Payer: Self-pay

## 2023-06-29 ENCOUNTER — Other Ambulatory Visit: Payer: Self-pay

## 2023-06-29 DIAGNOSIS — Z03818 Encounter for observation for suspected exposure to other biological agents ruled out: Secondary | ICD-10-CM | POA: Diagnosis not present

## 2023-06-29 DIAGNOSIS — H00021 Hordeolum internum right upper eyelid: Secondary | ICD-10-CM | POA: Diagnosis not present

## 2023-06-29 DIAGNOSIS — J019 Acute sinusitis, unspecified: Secondary | ICD-10-CM | POA: Diagnosis not present

## 2023-06-29 DIAGNOSIS — H109 Unspecified conjunctivitis: Secondary | ICD-10-CM | POA: Diagnosis not present

## 2023-06-29 MED ORDER — PROMETHAZINE-DM 6.25-15 MG/5ML PO SYRP
5.0000 mL | ORAL_SOLUTION | Freq: Four times a day (QID) | ORAL | 0 refills | Status: DC | PRN
  Filled 2023-06-29: qty 120, 6d supply, fill #0

## 2023-06-29 MED ORDER — FLUTICASONE PROPIONATE 50 MCG/ACT NA SUSP
1.0000 | Freq: Two times a day (BID) | NASAL | 0 refills | Status: DC
  Filled 2023-06-29: qty 16, 30d supply, fill #0

## 2023-06-29 MED ORDER — POLYMYXIN B-TRIMETHOPRIM 10000-0.1 UNIT/ML-% OP SOLN
1.0000 [drp] | OPHTHALMIC | 0 refills | Status: AC
  Filled 2023-06-29: qty 10, 34d supply, fill #0

## 2023-06-29 MED ORDER — DOXYCYCLINE HYCLATE 100 MG PO CAPS
100.0000 mg | ORAL_CAPSULE | Freq: Two times a day (BID) | ORAL | 0 refills | Status: DC
  Filled 2023-06-29: qty 14, 7d supply, fill #0

## 2023-07-26 ENCOUNTER — Other Ambulatory Visit: Payer: Self-pay

## 2023-07-26 MED ORDER — HYDROCODONE BIT-HOMATROP MBR 5-1.5 MG/5ML PO SOLN
5.0000 mL | Freq: Four times a day (QID) | ORAL | 0 refills | Status: DC | PRN
  Filled 2023-07-26 (×2): qty 60, 3d supply, fill #0

## 2023-07-26 MED ORDER — HYDROCODONE BIT-HOMATROP MBR 5-1.5 MG/5ML PO SOLN
ORAL | 0 refills | Status: AC
  Filled 2023-07-26: qty 60, 3d supply, fill #0

## 2023-07-26 MED ORDER — PREDNISONE 20 MG PO TABS
ORAL_TABLET | ORAL | 0 refills | Status: DC
  Filled 2023-07-26: qty 10, 5d supply, fill #0

## 2023-07-26 MED ORDER — LEVOFLOXACIN 500 MG PO TABS
ORAL_TABLET | ORAL | 0 refills | Status: DC
  Filled 2023-07-26: qty 10, 10d supply, fill #0

## 2023-07-27 ENCOUNTER — Other Ambulatory Visit: Payer: Self-pay

## 2023-07-28 ENCOUNTER — Other Ambulatory Visit: Payer: Self-pay

## 2023-08-11 ENCOUNTER — Telehealth: Payer: Managed Care, Other (non HMO) | Admitting: Physician Assistant

## 2023-08-11 DIAGNOSIS — B999 Unspecified infectious disease: Secondary | ICD-10-CM

## 2023-08-12 NOTE — Progress Notes (Signed)
  Because of ongoing symptoms after treatment for COVID and antibiotics/steroids for secondary bronchitis, I feel your condition warrants further evaluation and I recommend that you be seen in a face-to-face visit.   NOTE: There will be NO CHARGE for this E-Visit   If you are having a true medical emergency, please call 911.     For an urgent face to face visit, [facility] has multiple urgent care centers for your convenience.  Click the link below for the full list of locations and hours, walk-in wait times, appointment scheduling options and driving directions:  Urgent Care - [place], [place], [place], [place], [place], [place]  [place]     Your MyChart E-visit questionnaire answers were reviewed by a board certified advanced clinical practitioner to complete your personal care plan based on your specific symptoms.    Thank you for using e-Visits.

## 2023-09-22 ENCOUNTER — Other Ambulatory Visit: Payer: Self-pay

## 2023-09-22 MED ORDER — METHYLPREDNISOLONE 4 MG PO TBPK
ORAL_TABLET | ORAL | 0 refills | Status: AC
  Filled 2023-09-22: qty 21, 6d supply, fill #0

## 2023-09-22 MED ORDER — ALBUTEROL SULFATE HFA 108 (90 BASE) MCG/ACT IN AERS
2.0000 | INHALATION_SPRAY | Freq: Three times a day (TID) | RESPIRATORY_TRACT | 0 refills | Status: DC
  Filled 2023-09-22: qty 18, 30d supply, fill #0

## 2023-09-22 MED ORDER — OMEPRAZOLE 40 MG PO CPDR
40.0000 mg | DELAYED_RELEASE_CAPSULE | Freq: Every day | ORAL | 0 refills | Status: DC
  Filled 2023-09-22: qty 30, 30d supply, fill #0

## 2023-09-22 MED ORDER — BENZONATATE 200 MG PO CAPS
200.0000 mg | ORAL_CAPSULE | Freq: Three times a day (TID) | ORAL | 0 refills | Status: AC
  Filled 2023-09-22: qty 21, 7d supply, fill #0

## 2023-09-24 ENCOUNTER — Other Ambulatory Visit: Payer: Self-pay

## 2023-09-24 MED ORDER — IBUPROFEN 600 MG PO TABS
600.0000 mg | ORAL_TABLET | Freq: Three times a day (TID) | ORAL | 0 refills | Status: AC
  Filled 2023-09-24: qty 15, 5d supply, fill #0

## 2023-10-05 ENCOUNTER — Other Ambulatory Visit: Payer: Self-pay

## 2023-10-13 ENCOUNTER — Other Ambulatory Visit: Payer: Self-pay

## 2023-10-13 MED ORDER — MELOXICAM 15 MG PO TABS
ORAL_TABLET | ORAL | 1 refills | Status: AC
  Filled 2023-10-13: qty 30, 30d supply, fill #0

## 2024-01-26 ENCOUNTER — Other Ambulatory Visit: Payer: Self-pay

## 2024-01-26 MED ORDER — NEOMYCIN-POLYMYXIN-DEXAMETH 3.5-10000-0.1 OP OINT
1.0000 | TOPICAL_OINTMENT | Freq: Two times a day (BID) | OPHTHALMIC | 0 refills | Status: AC
  Filled 2024-01-26: qty 3.5, 3d supply, fill #0

## 2024-01-27 ENCOUNTER — Other Ambulatory Visit: Payer: Self-pay

## 2024-01-27 MED ORDER — LORAZEPAM 1 MG PO TABS
1.0000 mg | ORAL_TABLET | ORAL | 0 refills | Status: AC
  Filled 2024-01-27: qty 2, 1d supply, fill #0

## 2024-01-27 MED ORDER — DIAZEPAM 5 MG PO TABS
5.0000 mg | ORAL_TABLET | ORAL | 0 refills | Status: AC
  Filled 2024-01-27: qty 2, 1d supply, fill #0

## 2024-01-28 ENCOUNTER — Other Ambulatory Visit: Payer: Self-pay

## 2024-08-09 ENCOUNTER — Emergency Department: Payer: Self-pay

## 2024-08-09 ENCOUNTER — Emergency Department
Admission: EM | Admit: 2024-08-09 | Discharge: 2024-08-09 | Disposition: A | Discharge status: Home / Self Care | Payer: Self-pay | Source: Ambulatory Visit | Attending: Emergency Medicine | Admitting: Emergency Medicine

## 2024-08-09 ENCOUNTER — Other Ambulatory Visit: Payer: Self-pay

## 2024-08-09 DIAGNOSIS — R0789 Other chest pain: Secondary | ICD-10-CM | POA: Diagnosis present

## 2024-08-09 DIAGNOSIS — R1012 Left upper quadrant pain: Secondary | ICD-10-CM | POA: Insufficient documentation

## 2024-08-09 LAB — CBC
HCT: 45.4 % (ref 39.0–52.0)
Hemoglobin: 15.9 g/dL (ref 13.0–17.0)
MCH: 30.6 pg (ref 26.0–34.0)
MCHC: 35 g/dL (ref 30.0–36.0)
MCV: 87.3 fL (ref 80.0–100.0)
Platelets: 270 10*3/uL (ref 150–400)
RBC: 5.2 MIL/uL (ref 4.22–5.81)
RDW: 12.4 % (ref 11.5–15.5)
WBC: 6.3 10*3/uL (ref 4.0–10.5)
nRBC: 0 % (ref 0.0–0.2)

## 2024-08-09 LAB — BASIC METABOLIC PANEL WITH GFR
Anion gap: 11 (ref 5–15)
BUN: 14 mg/dL (ref 6–20)
CO2: 26 mmol/L (ref 22–32)
Calcium: 9.2 mg/dL (ref 8.9–10.3)
Chloride: 102 mmol/L (ref 98–111)
Creatinine, Ser: 0.8 mg/dL (ref 0.61–1.24)
GFR, Estimated: >60 mL/min
Glucose, Bld: 96 mg/dL (ref 70–99)
Potassium: 4.3 mmol/L (ref 3.5–5.1)
Sodium: 139 mmol/L (ref 135–145)

## 2024-08-09 LAB — TROPONIN T, HIGH SENSITIVITY
Troponin T High Sensitivity: 9 ng/L (ref 0–19)
Troponin T High Sensitivity: 8 ng/L (ref 0–19)

## 2024-08-09 MED ORDER — LIDOCAINE VISCOUS HCL 2 % MT SOLN
15.0000 mL | Freq: Once | OROMUCOSAL | Status: AC
  Administered 2024-08-09: 15 mL via OROMUCOSAL
  Filled 2024-08-09: qty 15

## 2024-08-09 MED ORDER — KETOROLAC TROMETHAMINE 30 MG/ML IJ SOLN
15.0000 mg | Freq: Once | INTRAMUSCULAR | Status: AC
  Administered 2024-08-09: 15 mg via INTRAVENOUS
  Filled 2024-08-09: qty 1

## 2024-08-09 MED ORDER — ALUM & MAG HYDROXIDE-SIMETH 200-200-20 MG/5ML PO SUSP
30.0000 mL | Freq: Once | ORAL | Status: AC
  Administered 2024-08-09: 30 mL via ORAL
  Filled 2024-08-09: qty 30

## 2024-08-09 NOTE — ED Triage Notes (Signed)
 Pt to ED via POV from home. Pt reports left sided CP that started while he was sitting at his desk at work. Pt reports pain has been intermittent.  Pain 9/10 currently. Some SOB.

## 2024-08-09 NOTE — ED Provider Notes (Signed)
 "  [facility] Provider Note    Event Date/Time   First MD Initiated Contact with Patient 08/09/24 1239     (approximate)   History   Chest Pain   HPI  Tyler Villarreal is a 59 y.o. male who presents to the ED for evaluation of Chest Pain   I review a routine PCP visit from June 2025, hx GERD.   Patient presents due to intermittent chest discomfort over the past 24 hours.  Reports sharp aching pain that comes and goes without associated symptoms.   Physical Exam   Triage Vital Signs: ED Triage Vitals  Encounter Vitals Group     BP 08/09/24 1128 (!) 162/112     Girls Systolic BP Percentile --      Girls Diastolic BP Percentile --      Boys Systolic BP Percentile --      Boys Diastolic BP Percentile --      Pulse Rate 08/09/24 1128 82     Resp 08/09/24 1128 17     Temp 08/09/24 1128 98.1 F (36.7 C)     Temp Source 08/09/24 1128 Oral     SpO2 08/09/24 1128 97 %     Weight --      Height --      Head Circumference --      Peak Flow --      Pain Score 08/09/24 1125 9     Pain Loc --      Pain Education --      Exclude from Growth Chart --     Most recent vital signs: Vitals:   08/09/24 1128  BP: (!) 162/112  Pulse: 82  Resp: 17  Temp: 98.1 F (36.7 C)  SpO2: 97%    General: Awake, no distress.  CV:  Good peripheral perfusion.  Resp:  Normal effort.  Abd:  No distention.  Minimal LUQ tenderness is present, abdomen is otherwise soft and nontender. MSK:  No deformity noted.  Reproducible chest discomfort on palpation without overlying skin changes, rash or signs of trauma. Neuro:  No focal deficits appreciated. Other:     ED Results / Procedures / Treatments   Labs (all labs ordered are listed, but only abnormal results are displayed) Labs Reviewed  BASIC METABOLIC PANEL WITH GFR  CBC  TROPONIN T, HIGH SENSITIVITY  TROPONIN T, HIGH SENSITIVITY    EKG Sinus rhythm with a rate of 80 bpm.  Normal axis and intervals.  No clear  signs of acute ischemia.  No comparison EKG.  RADIOLOGY CXR interpreted by me without evidence of acute cardiopulmonary pathology.  Official radiology report(s): DG Chest 2 View Result Date: 08/09/2024 EXAM: 2 VIEW(S) XRAY OF THE CHEST 08/09/2024 11:40:00 AM COMPARISON: None available. CLINICAL HISTORY: Chest pain. FINDINGS: LUNGS AND PLEURA: No focal pulmonary opacity. No pleural effusion. No pneumothorax. HEART AND MEDIASTINUM: No acute abnormality of the cardiac and mediastinal silhouettes. BONES AND SOFT TISSUES: No acute osseous abnormality. IMPRESSION: 1. No acute process. Electronically signed by: Norleen Boxer MD 08/09/2024 12:22 PM EST RP Workstation: [id number]    PROCEDURES and INTERVENTIONS:  .1-3 Lead EKG Interpretation  Performed by: Claudene Rover, MD Authorized by: Claudene Rover, MD     Interpretation: normal     ECG rate:  80   ECG rate assessment: normal     Rhythm: sinus rhythm     Ectopy: none     Conduction: normal     Medications  ketorolac  (TORADOL ) 30 MG/ML  injection 15 mg (15 mg Intravenous Given 08/09/24 1400)  alum & mag hydroxide-simeth (MAALOX/MYLANTA) 200-200-20 MG/5ML suspension 30 mL (30 mLs Oral Given 08/09/24 1400)  lidocaine  (XYLOCAINE ) 2 % viscous mouth solution 15 mL (15 mLs Mouth/Throat Given 08/09/24 1400)     IMPRESSION / MDM / ASSESSMENT AND PLAN / ED COURSE  I reviewed the triage vital signs and the nursing notes.  Differential diagnosis includes, but is not limited to, ACS, PTX, PNA, muscle strain/spasm, PE, dissection, anxiety, pleural effusion, gastritis  {Patient presents with symptoms of an acute illness or injury that is potentially life-threatening.  Patient presents for evaluation of atypical chest discomfort.  Fairly low risk from a cardiac perspective.  EKG is nonischemic and first troponin is negative.  Highest suspicion for MSK versus gastric etiology of his symptoms.  Normal CBC, metabolic panel and first troponin.  Will trend a  second troponin and empirically provide Toradol  for possible MSK etiology, GI cocktail for possible gastric etiology and reassess.  Anticipate he will be suitable for outpatient management if second troponin remains low  Second troponin remains low.  He feels well.  Suitable for outpatient management  Clinical Course as of 08/09/24 1438  Wed Aug 09, 2024  1437 Reassessed, improved symptoms.  We discussed possibility of cardiology referral and he declines this indicating just follow-up with his PCP.  We discussed ED return precautions [DS]    Clinical Course User Index [DS] Claudene Rover, MD     FINAL CLINICAL IMPRESSION(S) / ED DIAGNOSES   Final diagnoses:  Other chest pain     Rx / DC Orders   ED Discharge Orders     None        Note:  This document was prepared using Dragon voice recognition software and may include unintentional dictation errors.   Claudene Rover, MD 08/09/24 1438  "

## 2024-08-10 ENCOUNTER — Other Ambulatory Visit: Payer: Self-pay

## 2024-08-10 MED ORDER — OMEPRAZOLE 40 MG PO CPDR
40.0000 mg | DELAYED_RELEASE_CAPSULE | Freq: Every day | ORAL | 1 refills | Status: AC
  Filled 2024-08-10: qty 30, 30d supply, fill #0
